# Patient Record
Sex: Female | Born: 1960 | Hispanic: Yes | Marital: Married | Smoking: Never smoker
Health system: Southern US, Community
[De-identification: ages and names within clinical notes are randomized; demographics above are authoritative.]

## PROBLEM LIST (undated history)

## (undated) DIAGNOSIS — M1990 Unspecified osteoarthritis, unspecified site: Secondary | ICD-10-CM

## (undated) DIAGNOSIS — M25579 Pain in unspecified ankle and joints of unspecified foot: Secondary | ICD-10-CM

## (undated) DIAGNOSIS — F411 Generalized anxiety disorder: Secondary | ICD-10-CM

## (undated) DIAGNOSIS — J309 Allergic rhinitis, unspecified: Secondary | ICD-10-CM

## (undated) DIAGNOSIS — I10 Essential (primary) hypertension: Secondary | ICD-10-CM

## (undated) HISTORY — DX: Unspecified osteoarthritis, unspecified site: M19.90

## (undated) HISTORY — PX: ENDOMETRIAL ABLATION: SHX621

## (undated) HISTORY — DX: Pain in unspecified ankle and joints of unspecified foot: M25.579

## (undated) HISTORY — PX: OVARIAN CYST REMOVAL: SHX89

## (undated) HISTORY — DX: Generalized anxiety disorder: F41.1

## (undated) HISTORY — PX: OOPHORECTOMY: SHX86

## (undated) HISTORY — PX: TUBAL LIGATION: SHX77

## (undated) HISTORY — DX: Essential (primary) hypertension: I10

## (undated) HISTORY — DX: Allergic rhinitis, unspecified: J30.9

---

## 1999-05-20 ENCOUNTER — Other Ambulatory Visit: Admission: RE | Admit: 1999-05-20 | Discharge: 1999-05-20 | Payer: Self-pay | Admitting: *Deleted

## 2000-11-10 ENCOUNTER — Other Ambulatory Visit: Admission: RE | Admit: 2000-11-10 | Discharge: 2000-11-10 | Payer: Self-pay | Admitting: Obstetrics and Gynecology

## 2004-08-08 ENCOUNTER — Other Ambulatory Visit: Admission: RE | Admit: 2004-08-08 | Discharge: 2004-08-08 | Payer: Self-pay | Admitting: Obstetrics and Gynecology

## 2004-09-27 ENCOUNTER — Encounter: Admission: RE | Admit: 2004-09-27 | Discharge: 2004-09-27 | Payer: Self-pay | Admitting: Obstetrics and Gynecology

## 2005-11-10 ENCOUNTER — Emergency Department (HOSPITAL_COMMUNITY): Admission: EM | Admit: 2005-11-10 | Discharge: 2005-11-10 | Payer: Self-pay | Admitting: Family Medicine

## 2005-11-21 ENCOUNTER — Encounter: Admission: RE | Admit: 2005-11-21 | Discharge: 2005-11-21 | Payer: Self-pay | Admitting: Family Medicine

## 2006-01-05 ENCOUNTER — Encounter: Admission: RE | Admit: 2006-01-05 | Discharge: 2006-01-29 | Payer: Self-pay | Admitting: Family Medicine

## 2006-07-04 ENCOUNTER — Emergency Department (HOSPITAL_COMMUNITY): Admission: EM | Admit: 2006-07-04 | Discharge: 2006-07-05 | Payer: Self-pay | Admitting: Emergency Medicine

## 2009-08-09 ENCOUNTER — Encounter: Admission: RE | Admit: 2009-08-09 | Discharge: 2009-08-09 | Payer: Self-pay | Admitting: Specialist

## 2009-08-21 ENCOUNTER — Ambulatory Visit (HOSPITAL_COMMUNITY): Admission: RE | Admit: 2009-08-21 | Discharge: 2009-08-21 | Payer: Self-pay | Admitting: Obstetrics and Gynecology

## 2010-08-26 LAB — BASIC METABOLIC PANEL
Calcium: 9.2 mg/dL (ref 8.4–10.5)
Chloride: 101 mEq/L (ref 96–112)
Creatinine, Ser: 0.56 mg/dL (ref 0.4–1.2)
GFR calc Af Amer: >60 mL/min (ref >60)
GFR calc non Af Amer: >60 mL/min (ref >60)
Potassium: 3.9 mEq/L (ref 3.5–5.1)

## 2010-08-26 LAB — CBC
HCT: 39.2 % (ref 36.0–46.0)
Hemoglobin: 13.3 g/dL (ref 12.0–15.0)
RBC: 4.2 MIL/uL (ref 3.87–5.11)
WBC: 6.5 10*3/uL (ref 4.0–10.5)

## 2013-02-26 ENCOUNTER — Encounter: Payer: Self-pay | Admitting: Cardiology

## 2013-02-26 ENCOUNTER — Encounter: Payer: Self-pay | Admitting: *Deleted

## 2013-02-26 DIAGNOSIS — I10 Essential (primary) hypertension: Secondary | ICD-10-CM | POA: Insufficient documentation

## 2013-02-26 DIAGNOSIS — J309 Allergic rhinitis, unspecified: Secondary | ICD-10-CM | POA: Insufficient documentation

## 2013-02-26 DIAGNOSIS — M25579 Pain in unspecified ankle and joints of unspecified foot: Secondary | ICD-10-CM | POA: Insufficient documentation

## 2013-02-26 DIAGNOSIS — F411 Generalized anxiety disorder: Secondary | ICD-10-CM | POA: Insufficient documentation

## 2013-03-04 ENCOUNTER — Encounter: Payer: Self-pay | Admitting: Cardiology

## 2013-03-04 ENCOUNTER — Ambulatory Visit (INDEPENDENT_AMBULATORY_CARE_PROVIDER_SITE_OTHER): Payer: BC Managed Care – PPO | Admitting: Cardiology

## 2013-03-04 VITALS — BP 122/80 | HR 80 | Ht 66.0 in | Wt 198.0 lb

## 2013-03-04 DIAGNOSIS — R0600 Dyspnea, unspecified: Secondary | ICD-10-CM

## 2013-03-04 DIAGNOSIS — R001 Bradycardia, unspecified: Secondary | ICD-10-CM

## 2013-03-04 DIAGNOSIS — F419 Anxiety disorder, unspecified: Secondary | ICD-10-CM

## 2013-03-04 DIAGNOSIS — F411 Generalized anxiety disorder: Secondary | ICD-10-CM

## 2013-03-04 DIAGNOSIS — I498 Other specified cardiac arrhythmias: Secondary | ICD-10-CM

## 2013-03-04 DIAGNOSIS — I10 Essential (primary) hypertension: Secondary | ICD-10-CM

## 2013-03-04 DIAGNOSIS — R0609 Other forms of dyspnea: Secondary | ICD-10-CM

## 2013-03-04 NOTE — Progress Notes (Signed)
.      [address] [place], [place]  [address] Phone: [phone] Fax:  [phone]  Date:  03/04/2013   ID:  Wendy Freeman, DOB 05/15/1961, MRN [id number]  PCP:  No primary provider on file.   History of Present Illness: Wendy Freeman is a 52 y.o. female here for evaluation of dyspnea on exertion. She's been noted to have intermittent shortness of breath sometimes more severe than others associated with a sensation of feeling "tired ". This tends to go on for several minutes and resolves on its own. For instance, she was working on papers at the dinner table and when she stood up, felt tired and heavy breathing. When she took a full inspiration it seemed to resolve. She has been stressed recently with her daughter Tresa Endo who has been in bed rest to headaches. At times has the need to take in increased breaths with episodic tired sensations. Feels like she needs air. She stops and rests but still feels like heart is going faster. Last night she had a little chest pain, pin point above her left breast with same SOB feeling. Can happen with stairs or sitting.  No recent cough, no fevers, no bleeding, no syncope, no chest pain. EKG done at her primary physician's office, Dr. Paulino Rily, showed a heart rate of 46 beats per minute, personally viewed, sinus bradycardia with sinus arrhythmia. QT intervals were normal. No ST segment changes. She is currently not on any beta blockers or calcium channel blockers or other AV nodal blocking agents which would reduce her heart rate.  Brother had MI at 20. He did not take care of himself she says, Tob.   She may have had a stress test many years ago.   Currently takes HTN meds  Today's EKG shows sinus rhythm heart rate of 77 with no ST segment changes. Creatinine is 0.73, potassium 5.4, TSH normal at 1.5, hemoglobin normal at 13.9.   Wt Readings from Last 3 Encounters:  03/04/13 198 lb (89.812 kg)     Past Medical History    Diagnosis Date  . HTN (hypertension)   . Allergic rhinitis   . GAD (generalized anxiety disorder)   . Pain in joint, ankle and foot     Past Surgical History  Procedure Laterality Date  . Oophorectomy      Current Outpatient Prescriptions  Medication Sig Dispense Refill  . albuterol (PROVENTIL HFA;VENTOLIN HFA) 108 (90 BASE) MCG/ACT inhaler Inhale 2 puffs into the lungs every 6 (six) hours as needed for wheezing.      Marland Kitchen aspirin 81 MG tablet Take 81 mg by mouth daily.      . Calcium 600-200 MG-UNIT per tablet Take 1 tablet by mouth daily.      . cholecalciferol (VITAMIN D) 1000 UNITS tablet Take 1,000 Units by mouth daily.      Marland Kitchen FLUoxetine (PROZAC) 10 MG tablet Take 10 mg by mouth daily.      Marland Kitchen losartan-hydrochlorothiazide (HYZAAR) 50-12.5 MG per tablet Take 1 tablet by mouth daily.      . vitamin E 100 UNIT capsule Take 100 Units by mouth daily.       No current facility-administered medications for this visit.    Allergies:   No Known Allergies  Social History:  The patient  reports that she has never smoked. She does not have any smokeless tobacco history on file. She reports that she does not use illicit drugs.   Family  History  Problem Relation Age of Onset  . Heart attack Brother     ROS:  Please see the history of present illness.    No syncope, no orthopnea, no PND, no dysphagia, no strokelike symptoms, no headaches, no vertigo.  All other systems reviewed and negative.   PHYSICAL EXAM: VS:  BP 122/80  Pulse 80  Ht 5\' 6"  (1.676 m)  Wt 198 lb (89.812 kg)  BMI 31.97 kg/m2 Well nourished, well developed, in no acute distress HEENT: normal, [place]/AT, EOMI Neck: no JVD, normal carotid upstroke, no bruit Cardiac:  normal S1, S2; RRR; no murmur Lungs:  clear to auscultation bilaterally, no wheezing, rhonchi or rales Abd: soft, nontender, no hepatomegaly, no bruits Ext: no edema, 2+ distal pulses Skin: warm and dry GU: deferred Neuro: no focal abnormalities noted,  AAO x 3  EKG:  As above. Currently sinus rhythm with no ST segment changes.   ASSESSMENT AND PLAN:  52 year old female with intermittent dyspnea on exertion, transient bradycardia, normal appearing EKG today with strong family history, brother having myocardial infarction at 53 and dying.  1. Dyspnea-I would like to proceed with exercise treadmill test to ensure that she does not have any evidence of ischemia and to check her chronotropic competence given her prior transient bradycardia. Today's EKG is reassuring. I will also check an echocardiogram to encourage proper structure and function of her heart and ensure that she does not have any evidence of cardiomyopathy with her brothers early death. 2. Sinus bradycardia-current EKG reassuring with heart rate of 77. Prior 45 beats per minute. Question vagal. 3. Anxiety-currently under increased stress with her daughter.  Signed, Donato Schultz, MD [facility]  03/04/2013 10:36 AM

## 2013-03-04 NOTE — Patient Instructions (Addendum)
Your physician has requested that you have an exercise tolerance test. For further information please visit [email or web address] Please also follow instruction sheet, as given.  Your physician has requested that you have an echocardiogram. Echocardiography is a painless test that uses sound waves to create images of your heart. It provides your doctor with information about the size and shape of your heart and how well your heart's chambers and valves are working. This procedure takes approximately one hour. There are no restrictions for this procedure.  Your physician recommends that you continue on your current medications as directed. Please refer to the Current Medication list given to you today.  Your physician recommends that you follow-up as needed

## 2013-04-01 ENCOUNTER — Ambulatory Visit (HOSPITAL_COMMUNITY): Payer: BC Managed Care – PPO | Attending: Internal Medicine | Admitting: Radiology

## 2013-04-01 ENCOUNTER — Encounter: Payer: BC Managed Care – PPO | Admitting: Cardiology

## 2013-04-01 ENCOUNTER — Ambulatory Visit (INDEPENDENT_AMBULATORY_CARE_PROVIDER_SITE_OTHER): Payer: BC Managed Care – PPO | Admitting: Cardiology

## 2013-04-01 DIAGNOSIS — I079 Rheumatic tricuspid valve disease, unspecified: Secondary | ICD-10-CM | POA: Insufficient documentation

## 2013-04-01 DIAGNOSIS — R0602 Shortness of breath: Secondary | ICD-10-CM | POA: Insufficient documentation

## 2013-04-01 DIAGNOSIS — R0609 Other forms of dyspnea: Secondary | ICD-10-CM

## 2013-04-01 DIAGNOSIS — R0600 Dyspnea, unspecified: Secondary | ICD-10-CM

## 2013-04-01 DIAGNOSIS — I10 Essential (primary) hypertension: Secondary | ICD-10-CM | POA: Insufficient documentation

## 2013-04-01 DIAGNOSIS — R079 Chest pain, unspecified: Secondary | ICD-10-CM | POA: Insufficient documentation

## 2013-04-01 NOTE — Progress Notes (Signed)
Echocardiogram performed.  

## 2013-04-01 NOTE — Progress Notes (Signed)
Exercise Treadmill Test  Pre-Exercise Testing Evaluation Rhythm: sinus bradycardia  Rate: 54 bpm     Test  Exercise Tolerance Test Ordering MD: Donato Schultz, MD  Interpreting MD: Donato Schultz, MD  Unique Test No: 1  Treadmill:  1  Indication for ETT: exertional dyspnea  Contraindication to ETT: No   Stress Modality: exercise - treadmill  Cardiac Imaging Performed: non   Protocol: standard Bruce - maximal  Max BP:  162/87  Max MPHR (bpm):  **171* 85% MPR (bpm):  145  MPHR obtained (bpm):  157 % MPHR obtained:  91  Reached 85% MPHR (min:sec):  3:35 Total Exercise Time (min-sec):   Workload in METS:  7.0 Borg Scale: 19  Reason ETT Terminated:  dyspnea    ST Segment Analysis At Rest: normal ST segments - no evidence of significant ST depression With Exercise: no evidence of significant ST depression  Other Information Arrhythmia:  No Angina during ETT:  absent (0) Quality of ETT:  diagnostic  ETT Interpretation:  normal - no evidence of ischemia by ST analysis Overall low risk, however moderately decreased exercise tolerance noted.   Comments: Promote exercise. Overall reassuring.   Recommendations: As above.

## 2014-01-12 ENCOUNTER — Encounter: Payer: Self-pay | Admitting: Internal Medicine

## 2014-02-24 ENCOUNTER — Encounter: Payer: Self-pay | Admitting: Internal Medicine

## 2014-03-13 ENCOUNTER — Encounter: Payer: BC Managed Care – PPO | Admitting: Internal Medicine

## 2014-05-05 ENCOUNTER — Encounter: Payer: BC Managed Care – PPO | Admitting: Internal Medicine

## 2014-12-12 ENCOUNTER — Other Ambulatory Visit: Payer: Self-pay | Admitting: Family Medicine

## 2014-12-12 DIAGNOSIS — R1084 Generalized abdominal pain: Secondary | ICD-10-CM

## 2014-12-15 ENCOUNTER — Ambulatory Visit
Admission: RE | Admit: 2014-12-15 | Discharge: 2014-12-15 | Disposition: A | Discharge status: Home / Self Care | Payer: Self-pay | Source: Ambulatory Visit | Attending: Family Medicine | Admitting: Family Medicine

## 2014-12-15 DIAGNOSIS — R1084 Generalized abdominal pain: Secondary | ICD-10-CM

## 2016-12-16 IMAGING — CT CT ABD-PELV W/O CM
1 of 2 series · 15 of 32 positions shown, 19 images · non-contrast
Comparison: None.

CLINICAL DATA: Generalized abdominal pain for 3 weeks. Right groin
soft tissue prominence. Possible inguinal hernia.

EXAM:
CT ABDOMEN AND PELVIS WITHOUT CONTRAST
TECHNIQUE: Multidetector CT imaging of the abdomen and pelvis was performed
following the standard protocol without IV contrast.

[Series 3: abd pelvis wo 5.0 i41s 1 · axial · 0.83mm/px · z∈[-528,-88]mm · 15 of 96 slices shown, 19 images]
[im 4/96  soft-tissue]
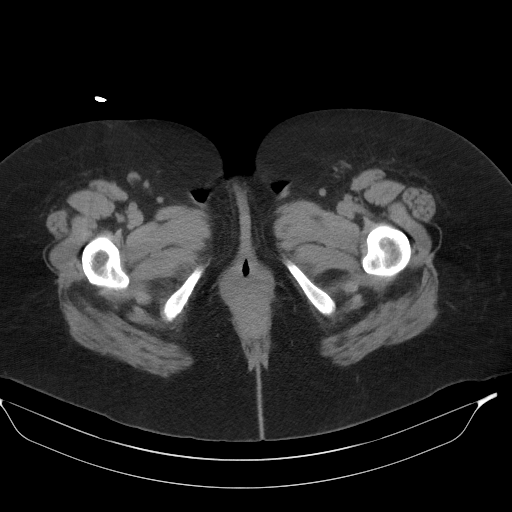
[im 4/96  bone]
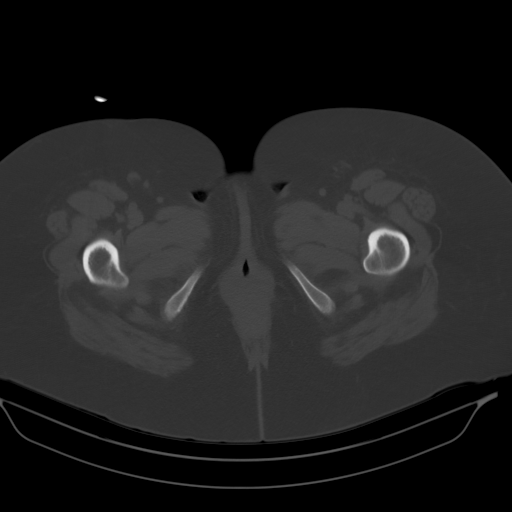
[im 12/96  soft-tissue]
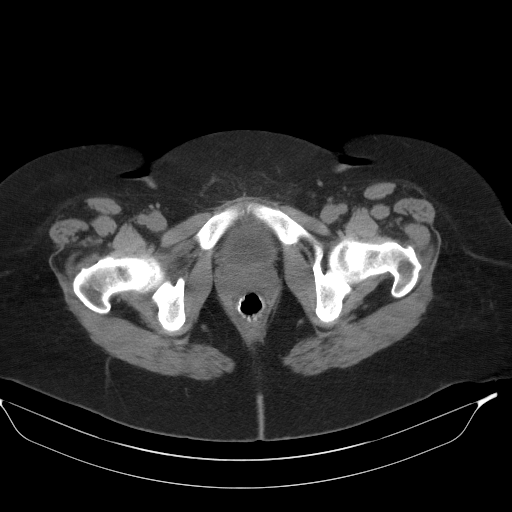
[im 20/96  soft-tissue]
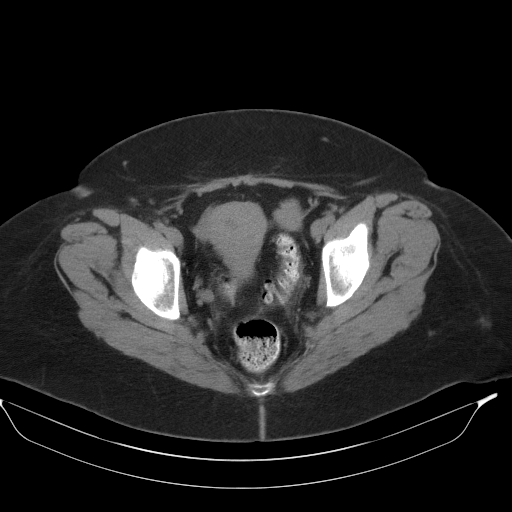
[im 27/96  soft-tissue]
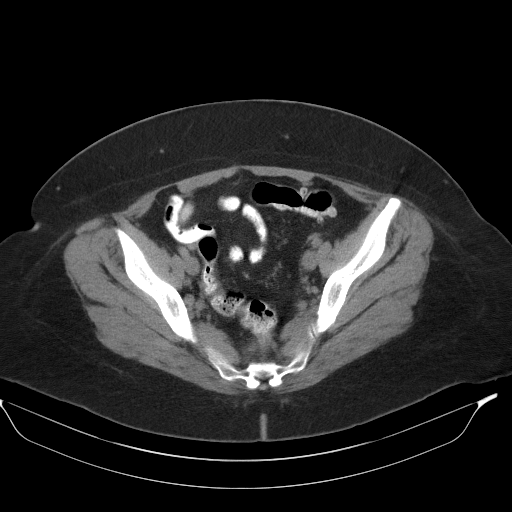
[im 35/96  soft-tissue]
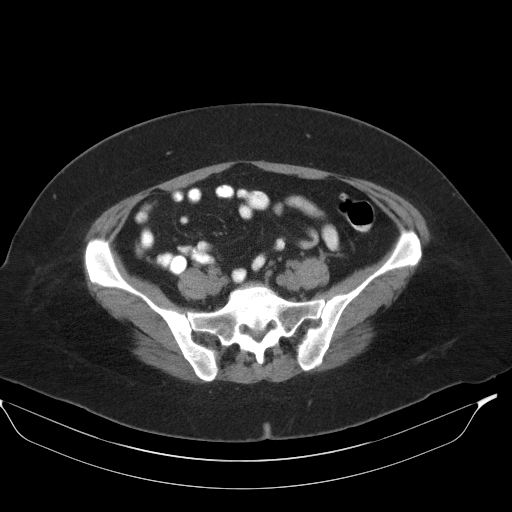
[im 42/96  soft-tissue]
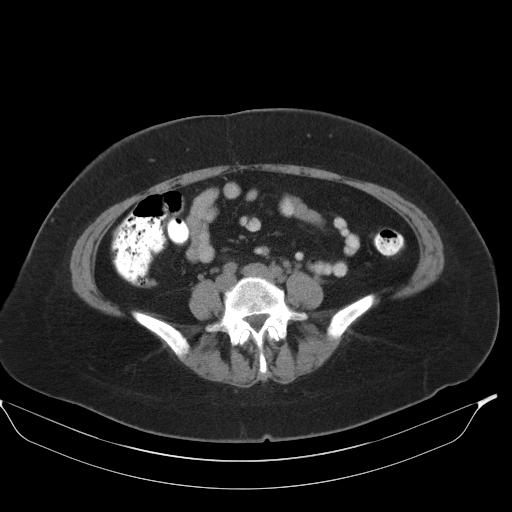
[im 50/96  soft-tissue]
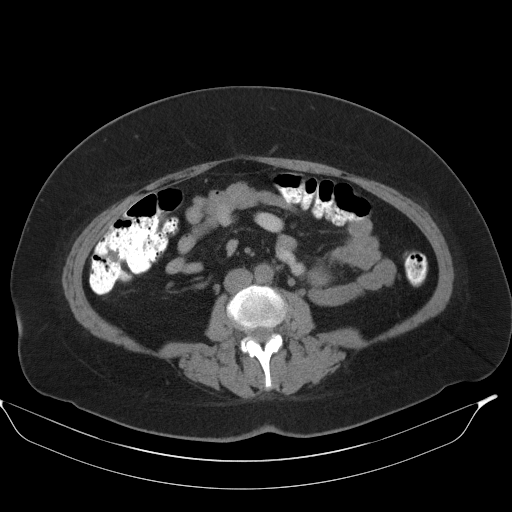
[im 54/96  soft-tissue]
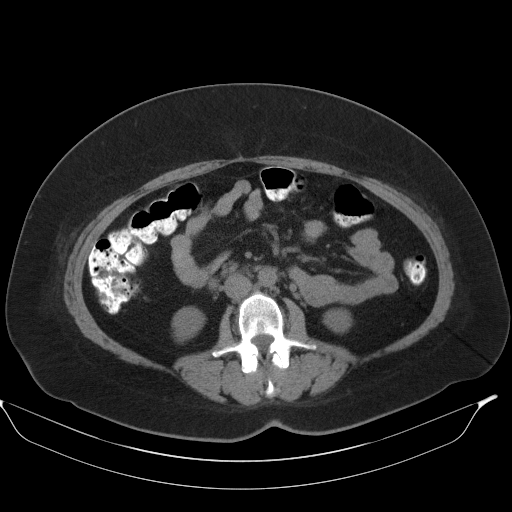
[im 61/96  soft-tissue]
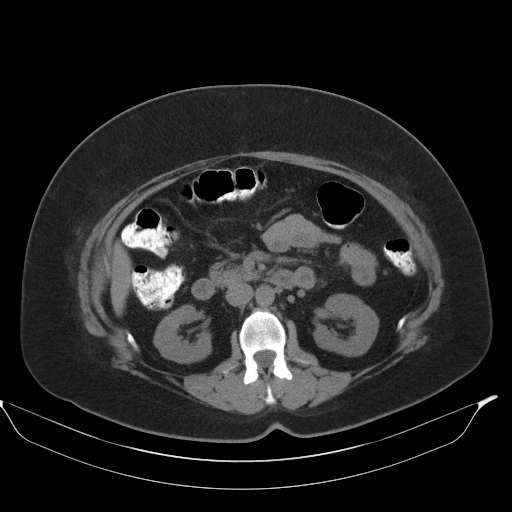
[im 61/96  bone]
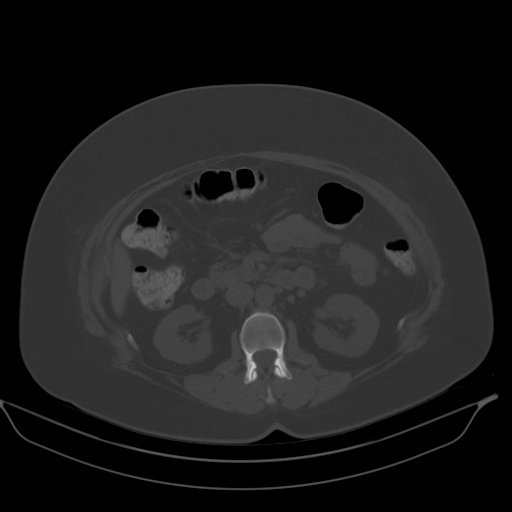
[im 69/96  soft-tissue]
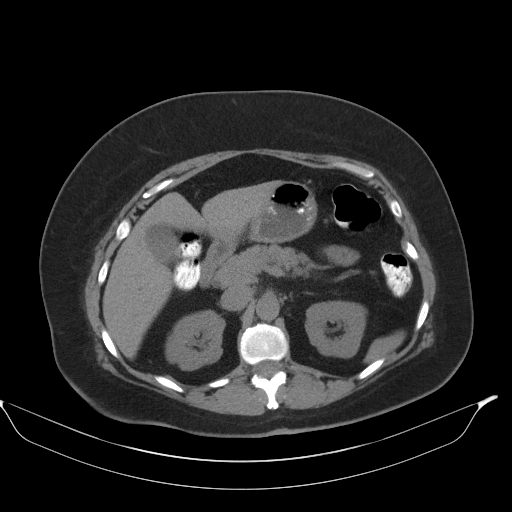
[im 77/96  soft-tissue]
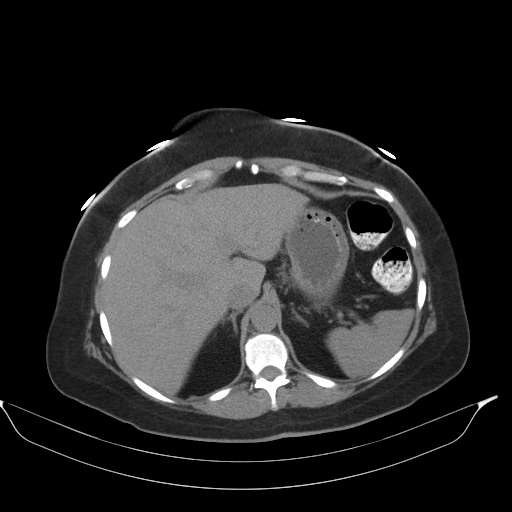
[im 80/96  lung]
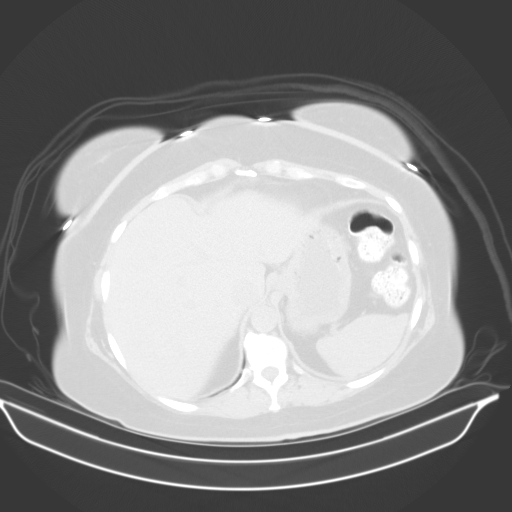
[im 84/96  soft-tissue]
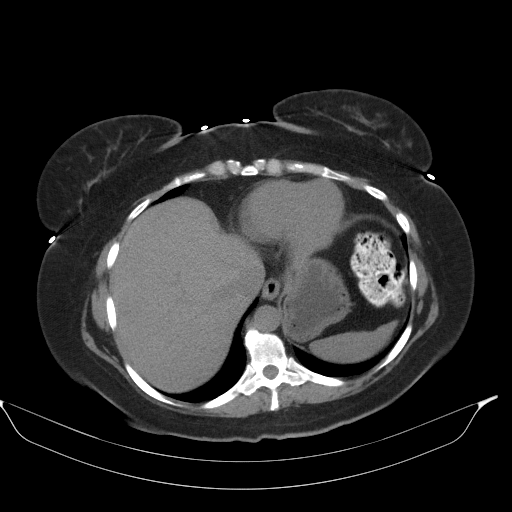
[im 84/96  lung]
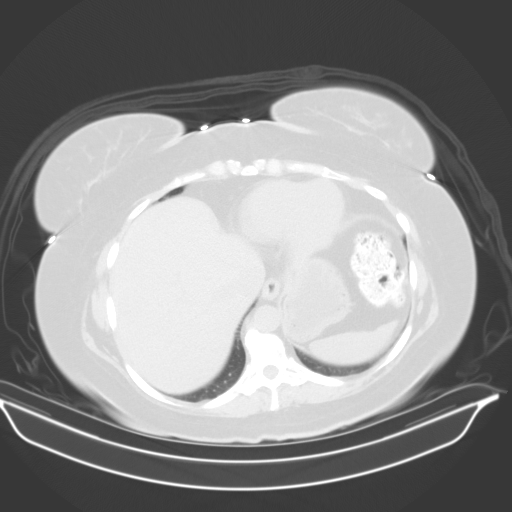
[im 88/96  lung]
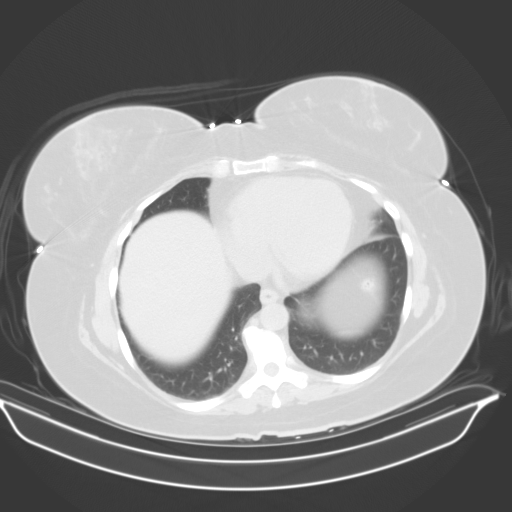
[im 92/96  soft-tissue]
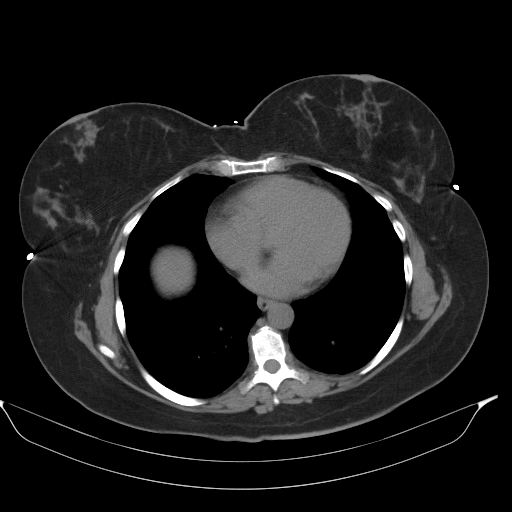
[im 92/96  lung]
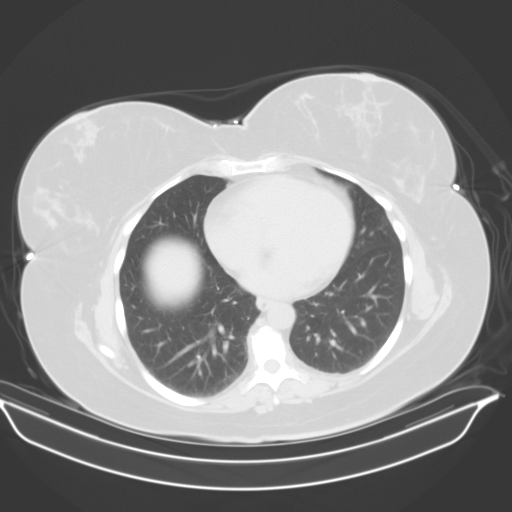

[15 of 32 positions shown; findings below may reference images not displayed]

FINDINGS: Lower chest: No acute findings.

Hepatobiliary: No mass visualized on this unenhanced exam.
Gallbladder is unremarkable.

Pancreas: No mass or inflammatory process visualized on this
unenhanced exam.

Spleen:  Within normal limits in size.

Adrenal Glands:  No masses identified.

Kidneys/Urinary tract: No evidence of urolithiasis or
hydronephrosis.

Stomach/Bowel/Peritoneum: Diverticulosis is seen involving the
descending and sigmoid colon. No evidence of diverticulitis or other
inflammatory process. No abnormal fluid collections. Normal appendix
visualized.

Vascular/Lymphatic: No pathologically enlarged lymph nodes
identified. No abdominal aortic aneurysm or other significant
retroperitoneal abnormality demonstrated.

Reproductive:  No mass or other significant abnormality noted.

Other:  No evidence of inguinal hernia or mass.

Musculoskeletal:  No suspicious bone lesions identified.
IMPRESSION: Colonic diverticulosis. No radiographic evidence of diverticulitis
or other acute findings.

No evidence of inguinal hernia or mass.

## 2018-02-24 ENCOUNTER — Ambulatory Visit: Payer: BC Managed Care – PPO | Admitting: Obstetrics and Gynecology

## 2018-02-24 ENCOUNTER — Encounter: Payer: Self-pay | Admitting: Obstetrics and Gynecology

## 2018-02-24 VITALS — BP 110/64 | HR 66 | Resp 14 | Ht 66.0 in | Wt 210.8 lb

## 2018-02-24 DIAGNOSIS — N3946 Mixed incontinence: Secondary | ICD-10-CM

## 2018-02-24 DIAGNOSIS — N8110 Cystocele, unspecified: Secondary | ICD-10-CM | POA: Diagnosis not present

## 2018-02-24 LAB — POCT URINALYSIS DIPSTICK
Bilirubin, UA: NEGATIVE
GLUCOSE UA: NEGATIVE
KETONES UA: NEGATIVE
Nitrite, UA: NEGATIVE
Protein, UA: NEGATIVE
Urobilinogen, UA: 0.2 E.U./dL
pH, UA: 5 (ref 5.0–8.0)

## 2018-02-24 NOTE — Progress Notes (Signed)
GYNECOLOGY  VISIT   HPI: 57 y.o.   Married  Hispanic  female   No obstetric history on file. with Patient's last menstrual period was 07/03/2009 (approximate).   here for   Urinary incontinence for a few years.  Referred by a friend.   Leaks with a cough, laugh, sneeze or exercise.  Voiding often to avoid leaking large amounts.  Sometimes small amounts.  Stream is not straight. DF - every 30 min to 3 hours. NF - 2 to 4 times.  4 times if she drinks water before going to bed. No enuresis.  Feels she is voiding well.  Denies dysuria, hematuria.  UTIs in the past.  No pyelo or renal stones.   No prior bladder surgery.   No fecal incontinence.  No real constipation unless she changes her diet.   No prolapse she is aware.   Sexually active and no pain of problems with intercourse.   Told her uterus had a different position.   Drinks Public relations account executive.   Urine dip - trace leukocytes.  PCP - Mila Palmer, MD GYN [facility] Coho, MD  GYNECOLOGIC HISTORY: Patient's last menstrual period was 07/03/2009 (approximate). Contraception: None  Menopausal hormone therapy:  None  Last mammogram:  3/19 at [facility] Normal per patient  Last pap smear:   07/2017 [facility]  Normal per patient         OB History   None      Gravida 2, Para 2.  First delivery 6 pounds, 13 ounces, forceps.  Second delivery 6 pounds, 5 ounces.   Patient Active Problem List   Diagnosis Date Noted  . HTN (hypertension)   . Allergic rhinitis   . GAD (generalized anxiety disorder)   . Pain in joint, ankle and foot     Past Medical History:  Diagnosis Date  . Allergic rhinitis   . GAD (generalized anxiety disorder)   . HTN (hypertension)   . Pain in joint, ankle and foot     Past Surgical History:  Procedure Laterality Date  . ENDOMETRIAL ABLATION    . OOPHORECTOMY     Right oophorectomy  . OVARIAN CYST REMOVAL Left   . TUBAL LIGATION      Current  Outpatient Medications  Medication Sig Dispense Refill  . albuterol (PROVENTIL HFA;VENTOLIN HFA) 108 (90 BASE) MCG/ACT inhaler Inhale 2 puffs into the lungs every 6 (six) hours as needed for wheezing.    Marland Kitchen aspirin 81 MG tablet Take 81 mg by mouth daily.    . Calcium 600-200 MG-UNIT per tablet Take 1 tablet by mouth daily.    . cholecalciferol (VITAMIN D) 1000 UNITS tablet Take 1,000 Units by mouth daily.    Marland Kitchen FLUoxetine (PROZAC) 10 MG tablet Take 10 mg by mouth daily.    Marland Kitchen losartan-hydrochlorothiazide (HYZAAR) 50-12.5 MG per tablet Take 1 tablet by mouth daily.    . vitamin E 100 UNIT capsule Take 100 Units by mouth daily.     No current facility-administered medications for this visit.      ALLERGIES: Patient has no known allergies.  Family History  Problem Relation Age of Onset  . Heart attack Brother     Social History   Socioeconomic History  . Marital status: Married    Spouse name: Not on file  . Number of children: Not on file  . Years of education: Not on file  . Highest education level: Not on file  Occupational History  . Not  on file  Social Needs  . Financial resource strain: Not on file  . Food insecurity:    Worry: Not on file    Inability: Not on file  . Transportation needs:    Medical: Not on file    Non-medical: Not on file  Tobacco Use  . Smoking status: Never Smoker  Substance and Sexual Activity  . Alcohol use: Not on file  . Drug use: No  . Sexual activity: Not on file  Lifestyle  . Physical activity:    Days per week: Not on file    Minutes per session: Not on file  . Stress: Not on file  Relationships  . Social connections:    Talks on phone: Not on file    Gets together: Not on file    Attends religious service: Not on file    Active member of club or organization: Not on file    Attends meetings of clubs or organizations: Not on file    Relationship status: Not on file  . Intimate partner violence:    Fear of current or ex partner:  Not on file    Emotionally abused: Not on file    Physically abused: Not on file    Forced sexual activity: Not on file  Other Topics Concern  . Not on file  Social History Narrative   Daughter, Tresa Endo, has pseudotumor cerebri.    Review of Systems  Genitourinary:       Urinary incontinence    PHYSICAL EXAMINATION:    BP 110/64   Pulse 66   Resp 14   Ht 5\' 6"  (1.676 m)   Wt 210 lb 12.8 oz (95.6 kg)   LMP 07/03/2009 (Approximate)   BMI 34.02 kg/m     General appearance: alert, cooperative and appears stated age Head: Normocephalic, without obvious abnormality, atraumatic Neck: no adenopathy, supple, symmetrical, trachea midline and thyroid normal to inspection and palpation Lungs: clear to auscultation bilaterally Heart: regular rate and rhythm Abdomen: soft, non-tender, no masses,  no organomegaly Extremities: extremities normal, atraumatic, no cyanosis or edema Skin: Skin color, texture, turgor normal. No rashes or lesions No abnormal inguinal nodes palpated Neurologic: Grossly normal  Pelvic: External genitalia:  no lesions              Urethra:  normal appearing urethra with no masses, tenderness or lesions              Bartholins and Skenes: normal                 Vagina: normal appearing vagina with normal color and discharge, no lesions.  First degree cystocele.              Cervix: no lesions                Bimanual Exam:  Uterus:  normal size, contour, position, consistency, mobility, non-tender              Adnexa: no mass, fullness, tenderness              Rectal exam: Yes.  .  Confirms.              Anus:  normal sphincter tone, no lesions  Chaperone was present for exam.  ASSESSMENT  Mixed incontinence.  First degree bladder prolapse.   PLAN  I have had a comprehensive discussion with the patient regarding prolapse and urinary incontinence and a discussion of pelvic anatomy.  I have provided reading  materials from [employer] regarding prolapse and  incontinence in general as well as medical and surgical treatment for these conditions.   We talked about observational management also.  Medical treatments may include physical therapy, pessary use, Impressa, medication, weight loss, and avoidance of bladder irritants.  We discussed surgical care options including an anterior and TVT Exact midurethral sling and cystoscopy. We discussed benefits and risks of surgery which include but are not limited to infection, cystotomy, erosion and exposure of permanent mesh in the vagina, urethra, bladder or ureters, slower voiding and urinary retention, overactive bladder symptoms, reoperation, and recurrence of prolapse and incontinence.  I have discussed surgical expectations regarding the procedures and success rates, outcomes, and recovery.     I would proceed with urodynamic testing prior to doing surgery if she chooses this option.  Urine micro and culture.   Follow up prn.   An After Visit Summary was printed and given to the patient.  __45____ minutes face to face time of which over 50% was spent in counseling.

## 2018-02-25 LAB — URINE CULTURE

## 2018-02-25 LAB — URINALYSIS, MICROSCOPIC ONLY: Casts: NONE SEEN /lpf

## 2018-02-27 DIAGNOSIS — N8110 Cystocele, unspecified: Secondary | ICD-10-CM | POA: Insufficient documentation

## 2018-02-27 DIAGNOSIS — N3946 Mixed incontinence: Secondary | ICD-10-CM | POA: Insufficient documentation

## 2019-02-28 ENCOUNTER — Ambulatory Visit: Payer: BC Managed Care – PPO | Admitting: Obstetrics and Gynecology

## 2019-08-13 ENCOUNTER — Ambulatory Visit: Payer: BC Managed Care – PPO | Attending: Internal Medicine

## 2019-08-13 DIAGNOSIS — Z23 Encounter for immunization: Secondary | ICD-10-CM

## 2019-08-13 NOTE — Progress Notes (Signed)
   Covid-19 Vaccination Clinic  Name:  LATRICA CLOWERS    MRN: [id number] DOB: [birth year]  08/13/2019  Ms. Lauro was observed post Covid-19 immunization for 15 minutes without incident. She was provided with Vaccine Information Sheet and instruction to access the V-Safe system.   Ms. Verdejo was instructed to call 911 with any severe reactions post vaccine: Marland Kitchen Difficulty breathing  . Swelling of face and throat  . A fast heartbeat  . A bad rash all over body  . Dizziness and weakness   Immunizations Administered    Name Date Dose VIS Date Route   Pfizer COVID-19 Vaccine 08/13/2019  9:31 AM 0.3 mL 05/13/2019 Intramuscular   Manufacturer: [employer], [employer]   Lot: [id number]   NDC: [address]-5

## 2019-09-05 ENCOUNTER — Ambulatory Visit: Payer: BC Managed Care – PPO | Attending: Internal Medicine

## 2019-09-05 ENCOUNTER — Ambulatory Visit: Payer: BC Managed Care – PPO

## 2019-09-05 DIAGNOSIS — Z23 Encounter for immunization: Secondary | ICD-10-CM

## 2019-09-05 NOTE — Progress Notes (Signed)
   Covid-19 Vaccination Clinic  Name:  Wendy Freeman    MRN: [id number] DOB: 1960/06/16  09/05/2019  [name] was observed post Covid-19 immunization for 15 minutes without incident. She was provided with Vaccine Information Sheet and instruction to access the V-Safe system.   Ms. Six was instructed to call 911 with any severe reactions post vaccine: Marland Kitchen Difficulty breathing  . Swelling of face and throat  . A fast heartbeat  . A bad rash all over body  . Dizziness and weakness   Immunizations Administered    Name Date Dose VIS Date Route   Pfizer COVID-19 Vaccine 09/05/2019 11:27 AM 0.3 mL 05/13/2019 Intramuscular   Manufacturer: [employer], [employer]   Lot: [id number]   NDC: [address]-2

## 2019-10-18 ENCOUNTER — Other Ambulatory Visit (HOSPITAL_BASED_OUTPATIENT_CLINIC_OR_DEPARTMENT_OTHER): Payer: Self-pay | Admitting: Family Medicine

## 2019-10-18 DIAGNOSIS — R102 Pelvic and perineal pain: Secondary | ICD-10-CM

## 2019-10-20 ENCOUNTER — Ambulatory Visit (HOSPITAL_BASED_OUTPATIENT_CLINIC_OR_DEPARTMENT_OTHER): Admission: RE | Admit: 2019-10-20 | Payer: BC Managed Care – PPO | Source: Ambulatory Visit

## 2022-03-18 ENCOUNTER — Other Ambulatory Visit (HOSPITAL_BASED_OUTPATIENT_CLINIC_OR_DEPARTMENT_OTHER): Payer: Self-pay | Admitting: Family Medicine

## 2022-03-18 ENCOUNTER — Ambulatory Visit (HOSPITAL_BASED_OUTPATIENT_CLINIC_OR_DEPARTMENT_OTHER)
Admission: RE | Admit: 2022-03-18 | Discharge: 2022-03-18 | Disposition: A | Discharge status: Home / Self Care | Payer: Commercial Managed Care - HMO | Source: Ambulatory Visit | Attending: Family Medicine | Admitting: Family Medicine

## 2022-03-18 DIAGNOSIS — R1031 Right lower quadrant pain: Secondary | ICD-10-CM

## 2022-03-18 LAB — POCT I-STAT CREATININE: Creatinine, Ser: 0.8 mg/dL (ref 0.44–1.00)

## 2022-03-18 MED ORDER — IOHEXOL 300 MG/ML  SOLN
100.0000 mL | Freq: Once | INTRAMUSCULAR | Status: AC | PRN
  Administered 2022-03-18: 80 mL via INTRAVENOUS

## 2022-03-20 ENCOUNTER — Other Ambulatory Visit: Payer: Self-pay | Admitting: Family Medicine

## 2022-03-20 DIAGNOSIS — N858 Other specified noninflammatory disorders of uterus: Secondary | ICD-10-CM

## 2022-03-21 ENCOUNTER — Ambulatory Visit (HOSPITAL_COMMUNITY)
Admission: RE | Admit: 2022-03-21 | Discharge: 2022-03-21 | Disposition: A | Discharge status: Home / Self Care | Payer: Commercial Managed Care - HMO | Source: Ambulatory Visit | Attending: Family Medicine | Admitting: Family Medicine

## 2022-03-21 DIAGNOSIS — N858 Other specified noninflammatory disorders of uterus: Secondary | ICD-10-CM | POA: Insufficient documentation

## 2022-03-25 ENCOUNTER — Other Ambulatory Visit: Payer: Commercial Managed Care - HMO

## 2022-05-15 ENCOUNTER — Encounter (INDEPENDENT_AMBULATORY_CARE_PROVIDER_SITE_OTHER): Payer: Self-pay | Admitting: Internal Medicine

## 2022-05-15 ENCOUNTER — Ambulatory Visit (INDEPENDENT_AMBULATORY_CARE_PROVIDER_SITE_OTHER): Payer: Commercial Managed Care - HMO | Admitting: Internal Medicine

## 2022-05-15 VITALS — BP 137/86 | HR 75 | Temp 98.3°F | Ht 65.0 in | Wt 209.0 lb

## 2022-05-15 DIAGNOSIS — Z6834 Body mass index (BMI) 34.0-34.9, adult: Secondary | ICD-10-CM | POA: Diagnosis not present

## 2022-05-15 DIAGNOSIS — E669 Obesity, unspecified: Secondary | ICD-10-CM

## 2022-05-15 DIAGNOSIS — M170 Bilateral primary osteoarthritis of knee: Secondary | ICD-10-CM

## 2022-05-15 DIAGNOSIS — I10 Essential (primary) hypertension: Secondary | ICD-10-CM | POA: Diagnosis not present

## 2022-05-15 DIAGNOSIS — Z0289 Encounter for other administrative examinations: Secondary | ICD-10-CM

## 2022-05-15 NOTE — Progress Notes (Signed)
Office: [phone]  /  Fax: [phone]   Initial Visit  Wendy Freeman was seen in clinic today to evaluate for obesity. She is interested in losing weight to improve overall health and reduce the risk of weight related complications. She presents today to review program treatment options, initial physical assessment, and evaluation.     She was referred by: Specialist gynecologist  When asked what else they would like to accomplish? She states: Adopt healthier eating patterns, Improve quality of life, and Improve appearance  When asked how has your weight affected you? She states: Contributed to medical problems and Contributed to orthopedic problems or mobility issues  Some associated conditions: Hypertension and Arthritis  Contributing factors: Family history, Disruption of circadian rhythm, Reduced physical activity, Pregnancy, and Menopause  Weight promoting medications identified: None  Current nutrition plan: Portion control / smart choices  Current level of physical activity: None  Current or previous pharmacotherapy: None  Response to medication: Never tried medications   Past medical history includes:   Past Medical History:  Diagnosis Date   Allergic rhinitis    GAD (generalized anxiety disorder)    HTN (hypertension)    Pain in joint, ankle and foot      Objective:   BP 137/86   Pulse 75   Temp 98.3 F (36.8 C)   Ht 5\' 5"  (1.651 m)   Wt 209 lb (94.8 kg)   SpO2 97%   BMI 34.78 kg/m  She was weighed on the bioimpedance scale: Body mass index is 34.78 kg/m.  Peak Weight: 217,Visceral Fat Rating: 13, Body Fat%: 47.2, Weight trend over the last 12 months: Decreasing  General:  Alert, oriented and cooperative. Patient is in no acute distress.  Respiratory: Normal respiratory effort, no problems with respiration noted  Extremities: Normal range of motion.    Mental Status: Normal mood and affect. Normal behavior. Normal judgment and thought  content.   Assessment and Plan:  1. Class 1 obesity with serious comorbidity and body mass index (BMI) of 34.0 to 34.9 in adult, unspecified obesity type We reviewed weight, biometrics, associated medical conditions and contributing factors with patient. She would benefit from weight loss therapy via a modified calorie, low-carb, high-protein nutritional plan tailored to their REE (resting energy expenditure) which will be determined by indirect calorimetry.  We will also assess for cardiometabolic risk and nutritional derangements via fasting serologies at her next appointment.   2. Hypertension, unspecified type Blood pressure today slightly above target of less than 130/80.  She is currently on losartan hydrochlorothiazide without any adverse effects.  I recommend she monitor her blood pressure in the morning before medications and before bedtime.  We will check renal parameters with intake labs.  I tried to review labs in Care Everywhere but there is nothing available her most recent BMP is from 2011.  3. Osteoarthritis of both knees, unspecified osteoarthritis type Patient reports having bilateral knee pain particularly when walking.  This may improve with 10 to 15% of body weight loss.        Obesity Treatment / Action Plan:  Patient will work on garnering support from family and friends to begin weight loss journey. Will work on eliminating or reducing the presence of highly palatable, calorie dense foods in the home. Will complete provided nutritional and psychosocial assessment questionnaire before the next appointment. Will be scheduled for indirect calorimetry to determine resting energy expenditure in a fasting state.  This will allow 2012 to create a reduced calorie,  high-protein meal plan to promote loss of fat mass while preserving muscle mass. Will think about ideas on how to incorporate physical activity into their daily routine. Counseled on the health benefits of losing 5%-15%  of total body weight. Was counseled on nutritional approaches to weight loss and benefits of complex carbs and high quality protein as part of nutritional weight management.  Obesity Education Performed Today:  She was weighed on the bioimpedance scale and results were discussed and documented in the synopsis.  We discussed obesity as a disease and the importance of a more detailed evaluation of all the factors contributing to the disease.  We discussed the importance of long term lifestyle changes which include nutrition, exercise and behavioral modifications as well as the importance of customizing this to her specific health and social needs.  We discussed the benefits of reaching a healthier weight to alleviate the symptoms of existing conditions and reduce the risks of the biomechanical, metabolic and psychological effects of obesity.  KENZA MUNAR appears to be in the action stage of change and states they are ready to start intensive lifestyle modifications and behavioral modifications.  30 minutes was spent today on this visit including the above counseling, pre-visit chart review, and post-visit documentation.  Reviewed by clinician on day of visit: allergies, medications, problem list, medical history, surgical history, family history, social history, and previous encounter notes.    I have reviewed the above documentation for accuracy and completeness, and I agree with the above.  Worthy Rancher, MD

## 2022-06-18 ENCOUNTER — Encounter (INDEPENDENT_AMBULATORY_CARE_PROVIDER_SITE_OTHER): Payer: Self-pay | Admitting: Internal Medicine

## 2022-06-18 ENCOUNTER — Ambulatory Visit (INDEPENDENT_AMBULATORY_CARE_PROVIDER_SITE_OTHER): Payer: Commercial Managed Care - HMO | Admitting: Internal Medicine

## 2022-06-18 VITALS — BP 126/84 | HR 72 | Temp 98.3°F | Ht 65.0 in | Wt 214.0 lb

## 2022-06-18 DIAGNOSIS — E669 Obesity, unspecified: Secondary | ICD-10-CM | POA: Diagnosis not present

## 2022-06-18 DIAGNOSIS — R5383 Other fatigue: Secondary | ICD-10-CM | POA: Diagnosis not present

## 2022-06-18 DIAGNOSIS — M170 Bilateral primary osteoarthritis of knee: Secondary | ICD-10-CM | POA: Diagnosis not present

## 2022-06-18 DIAGNOSIS — Z1331 Encounter for screening for depression: Secondary | ICD-10-CM

## 2022-06-18 DIAGNOSIS — Z6835 Body mass index (BMI) 35.0-35.9, adult: Secondary | ICD-10-CM

## 2022-06-18 DIAGNOSIS — R0602 Shortness of breath: Secondary | ICD-10-CM

## 2022-06-18 DIAGNOSIS — I10 Essential (primary) hypertension: Secondary | ICD-10-CM | POA: Diagnosis not present

## 2022-06-18 DIAGNOSIS — E559 Vitamin D deficiency, unspecified: Secondary | ICD-10-CM | POA: Diagnosis not present

## 2022-06-18 DIAGNOSIS — E66812 Obesity, class 2: Secondary | ICD-10-CM

## 2022-06-19 LAB — HEMOGLOBIN A1C
Est. average glucose Bld gHb Est-mCnc: 103 mg/dL
Hgb A1c MFr Bld: 5.2 % (ref 4.8–5.6)

## 2022-06-19 LAB — INSULIN, RANDOM: INSULIN: 5.7 u[IU]/mL (ref 2.6–24.9)

## 2022-06-24 NOTE — Progress Notes (Signed)
Chief Complaint:   Wendy Freeman (MR# [id number]) is a 62 y.o. female who presents for evaluation and treatment of obesity and related comorbidities. Current BMI is Body mass index is 35.61 kg/m. Wendy Freeman has been struggling with her weight for many years and has been unsuccessful in either losing weight, maintaining weight loss, or reaching her healthy weight goal.  Wendy Freeman is currently in the action stage of change and ready to dedicate time achieving and maintaining a healthier weight. Wendy Freeman is interested in becoming our patient and working on intensive lifestyle modifications including (but not limited to) diet and exercise for weight loss.  Wendy Freeman's habits were reviewed today and are as follows: Her family eats meals together, she thinks her family will eat healthier with her, her desired weight loss is 54 lbs, she has been heavy most of her life, she started gaining weight after childbirth, her heaviest weight ever was 217 pounds, she has significant food cravings issues, she snacks frequently in the evenings, she skips meals frequently, she is frequently drinking liquids with calories, and she frequently eats larger portions than normal.  Depression Screen [name]'s Food and Mood (modified PHQ-9) score was 7.  Subjective:   1. Other fatigue Wendy Freeman denies daytime somnolence and admits to waking up still tired. Patient has a history of symptoms of daytime fatigue and morning fatigue. Wendy Freeman generally gets 7 or 9 hours of sleep per night, and states that she has nightime awakenings and generally restful sleep. Snoring is present. Apneic episodes are not present. Epworth Sleepiness Score is 0. Reviewed labs from [facility]. Normal limits CBC, CMP, and TSH.   2. SOB (shortness of breath) on exertion Wendy Freeman notes increasing shortness of breath with exercising and seems to be worsening over time with weight gain. She notes getting out of breath sooner with activity than she  used to. This has not gotten worse recently. Wendy Freeman denies shortness of breath at rest or orthopnea.  3. Hypertension, unspecified type Blood pressure close to goal. On losartan HCTZ. Reviewed labs from [facility] on the patient's phone. Renal parameters within normal limits. I discussed labs with the patient today.   4. Osteoarthritis of both knees, unspecified osteoarthritis type Due to biomechanical complications from weight.   5. Vitamin D deficiency Vitamin D level 68 at [facility]. I discussed labs with the patient today.   Assessment/Plan:   1. Other fatigue Wendy Freeman does feel that her weight is causing her energy to be lower than it should be. Fatigue may be related to obesity, depression or many other causes. Labs will be ordered, and in the meanwhile, Wendy Freeman will focus on self care including making healthy food choices, increasing physical activity and focusing on stress reduction.  - EKG 12-Lead  2. SOB (shortness of breath) on exertion [name] does feel that she gets out of breath more easily that she used to when she exercises. [name]'s shortness of breath appears to be obesity related and exercise induced. She has agreed to work on weight loss and gradually increase exercise to treat her exercise induced shortness of breath. Will continue to monitor closely.  3. Hypertension, unspecified type Work on weight loss therapy. Continue current regimen.   4. Osteoarthritis of both knees, unspecified osteoarthritis type Work on weight loss therapy at 10%. May improve pain.   5. Vitamin D deficiency Decrease Vitamin D supplementation to every other day to avoid over-supplementation.   6. Depression screen Wendy Freeman had a positive depression screening. Depression is commonly associated  with obesity and often results in emotional eating behaviors. We will monitor this closely and work on CBT to help improve the non-hunger eating patterns. Referral to Psychology may be required if no improvement is  seen as she continues in our clinic.  7. Obesity with current BMI of 35.7 We will check labs today.   - Hemoglobin A1c - Insulin, random  Wendy Freeman is currently in the action stage of change and her goal is to continue with weight loss efforts. I recommend Wendy Freeman begin the structured treatment plan as follows:  She has agreed to the Category 2 Plan.  Exercise goals: All adults should avoid inactivity. Some physical activity is better than none, and adults who participate in any amount of physical activity gain some health benefits.   Behavioral modification strategies: increasing lean protein intake, decreasing simple carbohydrates, increasing vegetables, increasing water intake, decreasing liquid calories, decreasing eating out, no skipping meals, meal planning and cooking strategies, better snacking choices, avoiding temptations, and planning for success.  She was informed of the importance of frequent follow-up visits to maximize her success with intensive lifestyle modifications for her multiple health conditions. She was informed we would discuss her lab results at her next visit unless there is a critical issue that needs to be addressed sooner. Wendy Freeman agreed to keep her next visit at the agreed upon time to discuss these results.  Objective:   Blood pressure 126/84, pulse 72, temperature 98.3 F (36.8 C), height 5\' 5"  (1.651 m), weight 214 lb (97.1 kg), last menstrual period 07/03/2009, SpO2 98 %. Body mass index is 35.61 kg/m.  EKG: Normal sinus rhythm, rate 67 BPM.  Indirect Calorimeter completed today shows a VO2 of 196 and a REE of 1354.  Her calculated basal metabolic rate is 1696 thus her basal metabolic rate is worse than expected.  General: Cooperative, alert, well developed, in no acute distress. HEENT: Conjunctivae and lids unremarkable. Cardiovascular: Regular rhythm.  Lungs: Normal work of breathing. Neurologic: No focal deficits.   Lab Results  Component Value Date    CREATININE 0.80 03/18/2022   BUN 8 08/20/2009   NA 134 (L) 08/20/2009   K 3.9 08/20/2009   CL 101 08/20/2009   CO2 27 08/20/2009   No results found for: "ALT", "AST", "GGT", "ALKPHOS", "BILITOT" Lab Results  Component Value Date   HGBA1C 5.2 06/18/2022   Lab Results  Component Value Date   INSULIN 5.7 06/18/2022   No results found for: "TSH" No results found for: "CHOL", "HDL", "LDLCALC", "LDLDIRECT", "TRIG", "CHOLHDL" Lab Results  Component Value Date   WBC 6.5 08/20/2009   HGB 13.3 08/20/2009   HCT 39.2 08/20/2009   MCV 93.4 08/20/2009   PLT 236 08/20/2009   No results found for: "IRON", "TIBC", "FERRITIN"  Attestation Statements:   Reviewed by clinician on day of visit: allergies, medications, problem list, medical history, surgical history, family history, social history, and previous encounter notes.   Wilhemena Durie, am acting as transcriptionist for Thomes Dinning, MD.  I have reviewed the above documentation for accuracy and completeness, and I agree with the above. -Thomes Dinning, MD

## 2022-07-02 ENCOUNTER — Encounter (INDEPENDENT_AMBULATORY_CARE_PROVIDER_SITE_OTHER): Payer: Self-pay | Admitting: Internal Medicine

## 2022-07-02 ENCOUNTER — Ambulatory Visit (INDEPENDENT_AMBULATORY_CARE_PROVIDER_SITE_OTHER): Payer: Commercial Managed Care - HMO | Admitting: Internal Medicine

## 2022-07-02 VITALS — BP 128/87 | HR 75 | Temp 97.4°F | Ht 65.0 in

## 2022-07-02 DIAGNOSIS — E669 Obesity, unspecified: Secondary | ICD-10-CM | POA: Diagnosis not present

## 2022-07-02 DIAGNOSIS — E559 Vitamin D deficiency, unspecified: Secondary | ICD-10-CM | POA: Diagnosis not present

## 2022-07-02 DIAGNOSIS — I10 Essential (primary) hypertension: Secondary | ICD-10-CM | POA: Diagnosis not present

## 2022-07-02 DIAGNOSIS — M170 Bilateral primary osteoarthritis of knee: Secondary | ICD-10-CM | POA: Diagnosis not present

## 2022-07-02 DIAGNOSIS — Z6834 Body mass index (BMI) 34.0-34.9, adult: Secondary | ICD-10-CM

## 2022-07-02 NOTE — Assessment & Plan Note (Signed)
She had elevated vitamin D levels and since has stopped vitamin D supplementation.  She was advised to resume vitamin D at 2000 international units every other day.  We will check levels at 3 months.

## 2022-07-02 NOTE — Progress Notes (Signed)
Office: [phone]  /  Fax: [phone]  WEIGHT SUMMARY AND BIOMETRICS  Medical Weight Loss Height: 5\' 5"  (1.651 m) Weight: 210 lb (95.3 kg) Temp: (!) 97.4 F (36.3 C) Pulse Rate: 75 BP: 128/87 SpO2: 97 % Fasting: no Labs: no Today's Visit #: 2 Weight at Last VIsit: 214 lb Weight Lost Since Last Visit: 4 lb  Body Fat %: 47.7 % Fat Mass (lbs): 100.2 lbs Muscle Mass (lbs): 104.2 lbs Total Body Water (lbs): 75.8 lbs Visceral Fat Rating : 13 Peak Weight: 217 lb Starting Date: 06/18/22 Starting Weight: 214 lb Total Weight Loss (lbs): 4 lb (1.814 kg)    HPI  Chief Complaint: OBESITY  Wendy Freeman is here to discuss her progress with her obesity treatment plan. She is on the the Category 1 Plan and states she is following her eating plan approximately 80 % of the time. She states she is not exercising.   Interval History: Since last office she has lost 4 pounds.  She reports good adherence to reduced calorie plan, has increased vegetables and has reduced eating out.  She has also cut out processed foods.  Denies problems with appetite and hunger signals. Denies problems with satiety and satiation. Denies problems with eating patterns and portion control. Stress levels are reported as low and manageable. Barriers identified none.  She notes an improvement in cravings and if no longer frequently snacking.   Pharmacotherapy: None  PHYSICAL EXAM:  Blood pressure 128/87, pulse 75, temperature (!) 97.4 F (36.3 C), height 5\' 5"  (1.651 m), last menstrual period 07/03/2009, SpO2 97 %. Body mass index is 35.61 kg/m.  General: She is overweight, cooperative, alert, well developed, and in no acute distress. PSYCH: Has normal mood, affect and thought process.   HEENT: EOMI, sclerae are anicteric. Lungs: Normal breathing effort, no conversational dyspnea. Extremities: No edema.  Neurologic: No gross sensory or motor deficits. No tremors or fasciculations noted.    DIAGNOSTIC DATA  REVIEWED:  BMET    Component Value Date/Time   NA 134 (L) 08/20/2009 1224   K 3.9 08/20/2009 1224   CL 101 08/20/2009 1224   CO2 27 08/20/2009 1224   GLUCOSE 82 08/20/2009 1224   BUN 8 08/20/2009 1224   CREATININE 0.80 03/18/2022 1800   CALCIUM 9.2 08/20/2009 1224   GFRNONAA >60 08/20/2009 1224   GFRAA  08/20/2009 1224    >60        The eGFR has been calculated using the MDRD equation. This calculation has not been validated in all clinical situations. eGFR's persistently <60 mL/min signify possible Chronic Kidney Disease.   Lab Results  Component Value Date   HGBA1C 5.2 06/18/2022   Lab Results  Component Value Date   INSULIN 5.7 06/18/2022   No results found for: "TSH" CBC    Component Value Date/Time   WBC 6.5 08/20/2009 1224   RBC 4.20 08/20/2009 1224   HGB 13.3 08/20/2009 1224   HCT 39.2 08/20/2009 1224   PLT 236 08/20/2009 1224   MCV 93.4 08/20/2009 1224   MCHC 33.9 08/20/2009 1224   RDW 13.7 08/20/2009 1224   Iron Studies No results found for: "IRON", "TIBC", "FERRITIN", "IRONPCTSAT" Lipid Panel  No results found for: "CHOL", "TRIG", "HDL", "CHOLHDL", "VLDL", "LDLCALC", "LDLDIRECT" Hepatic Function Panel  No results found for: "PROT", "ALBUMIN", "AST", "ALT", "ALKPHOS", "BILITOT", "BILIDIR", "IBILI" No results found for: "TSH" Nutritional No results found for: "VD25OH"   ASSESSMENT AND PLAN  TREATMENT PLAN FOR OBESITY:  Recommended Dietary Goals  Wendy Freeman is currently in the action stage of change. As such, her goal is to continue weight management plan. She has agreed to the Category 1 Plan.  Behavioral Intervention  We discussed the following Behavioral Modification Strategies today: increasing lean protein intake, increasing vegetables, avoid skipping meals, think about ways to increase physical activity, avoid or reduce consumption of processed foods, reading food labels and making healthy choices when eating convenient foods, and increase  water .  Additional resources provided today: NA  Recommended Physical Activity Goals  Wendy Freeman has been advised to work up to 150 minutes of moderate intensity aerobic activity a week and strengthening exercises 2-3 times per week for cardiovascular health, weight loss maintenance and preservation of muscle mass.   She has agreed to increase physical activity in their day and reduce sedentary time (increase NEAT).    Pharmacotherapy We discussed various medication options to help Wendy Freeman with her weight loss efforts and we both agreed to continue with nutritional and behavioral strategies.  ASSOCIATED CONDITIONS ADDRESSED TODAY  Osteoarthritis of both knees, unspecified osteoarthritis type Assessment & Plan: Exacerbated by biomechanical effects of her weight.  Pain should improve with weight loss of 10% of body weight.  She will also be starting physical therapy.  We also discussed other exercise activities with less impact on her knees including aqua aerobics.   Hypertension, unspecified type Assessment & Plan: Blood pressure close to goal goal for age and risk category.  On ARB hydrochlorothiazide without adverse effects. Most recent renal parameters reviewed which showed normal electrolytes and kidney function.  Continue with weight loss therapy.  Monitor for symptoms of orthostasis while losing weight. Continue current regimen and home monitoring for a goal blood pressure of 120/80.    Obesity with current BMI of 34.9 Assessment & Plan: We reviewed most recent labs.  Her hemoglobin A1c was 5.2 and she has optimal insulin levels so no signs of prediabetes or insulin resistance.  Blood pressure should improve with weight loss.   Vitamin D deficiency Assessment & Plan: She had elevated vitamin D levels and since has stopped vitamin D supplementation.  She was advised to resume vitamin D at 2000 international units every other day.  We will check levels at 3 months.        Return in about 2 years (around 07/02/2024) for For Weight Mangement with Dr. Gerarda Fraction.Marland Kitchen She was informed of the importance of frequent follow up visits to maximize her success with intensive lifestyle modifications for her multiple health conditions.   ATTESTASTION STATEMENTS:  Reviewed by clinician on day of visit: allergies, medications, problem list, medical history, surgical history, family history, social history, and previous encounter notes.   Time spent on visit including pre-visit chart review and post-visit care and charting was 40 minutes.    Thomes Dinning, MD

## 2022-07-02 NOTE — Assessment & Plan Note (Signed)
Exacerbated by biomechanical effects of her weight.  Pain should improve with weight loss of 10% of body weight.  She will also be starting physical therapy.  We also discussed other exercise activities with less impact on her knees including aqua aerobics.

## 2022-07-02 NOTE — Assessment & Plan Note (Signed)
Blood pressure close to goal goal for age and risk category.  On ARB hydrochlorothiazide without adverse effects. Most recent renal parameters reviewed which showed normal electrolytes and kidney function.  Continue with weight loss therapy.  Monitor for symptoms of orthostasis while losing weight. Continue current regimen and home monitoring for a goal blood pressure of 120/80.

## 2022-07-02 NOTE — Assessment & Plan Note (Signed)
We reviewed most recent labs.  Her hemoglobin A1c was 5.2 and she has optimal insulin levels so no signs of prediabetes or insulin resistance.  Blood pressure should improve with weight loss.

## 2022-07-17 ENCOUNTER — Ambulatory Visit (INDEPENDENT_AMBULATORY_CARE_PROVIDER_SITE_OTHER): Payer: Commercial Managed Care - HMO | Admitting: Internal Medicine

## 2022-07-17 ENCOUNTER — Encounter (INDEPENDENT_AMBULATORY_CARE_PROVIDER_SITE_OTHER): Payer: Self-pay | Admitting: Internal Medicine

## 2022-07-17 VITALS — BP 115/8 | HR 68 | Temp 97.8°F | Ht 65.0 in | Wt 210.0 lb

## 2022-07-17 DIAGNOSIS — Z6834 Body mass index (BMI) 34.0-34.9, adult: Secondary | ICD-10-CM | POA: Diagnosis not present

## 2022-07-17 DIAGNOSIS — E669 Obesity, unspecified: Secondary | ICD-10-CM

## 2022-07-17 DIAGNOSIS — I10 Essential (primary) hypertension: Secondary | ICD-10-CM | POA: Diagnosis not present

## 2022-07-17 NOTE — Progress Notes (Signed)
Office: [phone]  /  Fax: [phone]  WEIGHT SUMMARY AND BIOMETRICS  Medical Weight Loss Height: 5' 5"$  (1.651 m) Weight: 210 lb (95.3 kg) Temp: 97.8 F (36.6 C) Pulse Rate: 68 BP: (!) 115/8 SpO2: 97 % Fasting: no Labs: no Today's Visit #: 3 Weight at Last VIsit: 210 lb Weight Lost Since Last Visit: 0 lb  Body Fat %: 48.8 % Fat Mass (lbs): 102.4 lbs Muscle Mass (lbs): 102.2 lbs Total Body Water (lbs): 79.6 lbs Visceral Fat Rating : 14 Peak Weight: 217 lb Starting Date: 06/18/22 Starting Weight: 214 lb Total Weight Loss (lbs): 4 lb (1.814 kg)    HPI  Chief Complaint: OBESITY  Wendy Freeman is here to discuss her progress with her obesity treatment plan. She is on the the Category 1 Plan and states she is following her eating plan approximately 80 % of the time. She states she is yard work for several hours weekly.   Interval History:  Since last office visit she remains weight neutral.   She reports good adherence to nutritional plan but acknowledges eating out several times over the last 2 weeks.  She did pick healthy food choices but is certain she exceeded calories.  Denies problems with appetite and hunger signals.  Denies problems with satiety and satiation.  Denies problems with eating patterns and portion control.  Stress levels are reported as high and due to she is getting married in June and is having construction at her home .  Barriers identified none.    Pharmacotherapy: None   PHYSICAL EXAM:  Blood pressure (!) 115/8, pulse 68, temperature 97.8 F (36.6 C), height 5' 5"$  (1.651 m), weight 210 lb (95.3 kg), last menstrual period 07/03/2009, SpO2 97 %. Body mass index is 34.95 kg/m.  General: She is overweight, cooperative, alert, well developed, and in no acute distress. PSYCH: Has normal mood, affect and thought process.   HEENT: EOMI, sclerae are anicteric. Lungs: Normal breathing effort, no conversational dyspnea. Extremities: No edema.   Neurologic: No gross sensory or motor deficits. No tremors or fasciculations noted.    ASSESSMENT AND PLAN  TREATMENT PLAN FOR OBESITY:  Recommended Dietary Goals  Zymia is currently in the action stage of change. As such, her goal is to continue weight management plan. She has agreed to the Category 1 Plan.  Behavioral Intervention  We discussed the following Behavioral Modification Strategies today: increasing lean protein intake, increasing vegetables, increasing fiber rich foods, avoid skipping meals, and increase water intake.  Additional resources provided today: NA  Recommended Physical Activity Goals  Arshia has been advised to work up to 150 minutes of moderate intensity aerobic activity a week and strengthening exercises 2-3 times per week for cardiovascular health, weight loss maintenance and preservation of muscle mass.   She has agreed to increase physical activity in their day and reduce sedentary time (increase NEAT).    Pharmacotherapy We discussed various medication options to help Aitanna with her weight loss efforts and we both agreed to continue with nutritional and behavioral strategies.  ASSOCIATED CONDITIONS ADDRESSED TODAY  Hypertension, unspecified type Assessment & Plan: Blood pressure at goal for age and risk category.  On losartan hydrochlorothiazide 50 mg without adverse effects.  Most recent renal parameters reviewed which showed normal electrolytes and kidney function.  Continue with weight loss therapy.  Monitor for symptoms of orthostasis while losing weight. Continue current regimen and home monitoring for a goal blood pressure of 120/80.    Obesity with current BMI of  34.9      DIAGNOSTIC DATA REVIEWED:  BMET    Component Value Date/Time   NA 134 (L) 08/20/2009 1224   K 3.9 08/20/2009 1224   CL 101 08/20/2009 1224   CO2 27 08/20/2009 1224   GLUCOSE 82 08/20/2009 1224   BUN 8 08/20/2009 1224   CREATININE 0.80 03/18/2022 1800    CALCIUM 9.2 08/20/2009 1224   GFRNONAA >60 08/20/2009 1224   GFRAA  08/20/2009 1224    >60        The eGFR has been calculated using the MDRD equation. This calculation has not been validated in all clinical situations. eGFR's persistently <60 mL/min signify possible Chronic Kidney Disease.   Lab Results  Component Value Date   HGBA1C 5.2 06/18/2022   Lab Results  Component Value Date   INSULIN 5.7 06/18/2022   No results found for: "TSH" CBC    Component Value Date/Time   WBC 6.5 08/20/2009 1224   RBC 4.20 08/20/2009 1224   HGB 13.3 08/20/2009 1224   HCT 39.2 08/20/2009 1224   PLT 236 08/20/2009 1224   MCV 93.4 08/20/2009 1224   MCHC 33.9 08/20/2009 1224   RDW 13.7 08/20/2009 1224   Iron Studies No results found for: "IRON", "TIBC", "FERRITIN", "IRONPCTSAT" Lipid Panel  No results found for: "CHOL", "TRIG", "HDL", "CHOLHDL", "VLDL", "LDLCALC", "LDLDIRECT" Hepatic Function Panel  No results found for: "PROT", "ALBUMIN", "AST", "ALT", "ALKPHOS", "BILITOT", "BILIDIR", "IBILI" No results found for: "TSH" Nutritional No results found for: "VD25OH"    Return in about 2 weeks (around 07/31/2022) for For Weight Mangement with Dr. Gerarda Fraction.Marland Kitchen She was informed of the importance of frequent follow up visits to maximize her success with intensive lifestyle modifications for her multiple health conditions.    ATTESTASTION STATEMENTS:  Reviewed by clinician on day of visit: allergies, medications, problem list, medical history, surgical history, family history, social history, and previous encounter notes.   Time spent on visit including pre-visit chart review and post-visit care and charting was 30 went up to 3 as she is that she see that she sees Mallie Mussel did she had not know that he Tatian murmur she was 1 that we refer minutes.    Thomes Dinning, MD

## 2022-07-17 NOTE — Assessment & Plan Note (Signed)
Blood pressure at goal for age and risk category.  On losartan hydrochlorothiazide 50 mg without adverse effects.  Most recent renal parameters reviewed which showed normal electrolytes and kidney function.  Continue with weight loss therapy.  Monitor for symptoms of orthostasis while losing weight. Continue current regimen and home monitoring for a goal blood pressure of 120/80.

## 2022-08-11 ENCOUNTER — Ambulatory Visit (INDEPENDENT_AMBULATORY_CARE_PROVIDER_SITE_OTHER): Payer: Commercial Managed Care - HMO | Admitting: Internal Medicine

## 2022-08-12 ENCOUNTER — Telehealth (INDEPENDENT_AMBULATORY_CARE_PROVIDER_SITE_OTHER): Payer: Self-pay

## 2022-08-12 ENCOUNTER — Ambulatory Visit (INDEPENDENT_AMBULATORY_CARE_PROVIDER_SITE_OTHER): Payer: Commercial Managed Care - HMO | Admitting: Internal Medicine

## 2022-08-12 VITALS — BP 130/87 | HR 69 | Temp 98.0°F | Ht 65.0 in | Wt 208.0 lb

## 2022-08-12 DIAGNOSIS — Z6834 Body mass index (BMI) 34.0-34.9, adult: Secondary | ICD-10-CM

## 2022-08-12 DIAGNOSIS — E669 Obesity, unspecified: Secondary | ICD-10-CM | POA: Diagnosis not present

## 2022-08-12 DIAGNOSIS — E559 Vitamin D deficiency, unspecified: Secondary | ICD-10-CM

## 2022-08-12 DIAGNOSIS — I10 Essential (primary) hypertension: Secondary | ICD-10-CM

## 2022-08-12 NOTE — Telephone Encounter (Signed)
error 

## 2022-08-12 NOTE — Progress Notes (Signed)
Office: [phone]  /  Fax: [phone]  WEIGHT SUMMARY AND BIOMETRICS  Vitals Temp: 98 F (36.7 C) BP: 130/87 Pulse Rate: 69 SpO2: 95 %   Anthropometric Measurements Height: '5\' 5"'$  (1.651 m) Weight: 208 lb (94.3 kg) BMI (Calculated): 34.61 Weight at Last Visit: 210 lb Weight Lost Since Last Visit: 2 lb Starting Weight: 214 lb Total Weight Loss (lbs): 6 lb (2.722 kg)   Body Composition  Body Fat %: 48.2 % Fat Mass (lbs): 100.6 lbs Muscle Mass (lbs): 102.8 lbs Total Body Water (lbs): 78.8 lbs Visceral Fat Rating : 13    HPI  Chief Complaint: OBESITY  Altheria is here to discuss her progress with her obesity treatment plan. She is on the the Category 1 Plan and states she is following her eating plan approximately 70 % of the time. She states she is doing yard work eight hours a week.  Interval History:  Since last office visit she has lost 2 pounds. She reports fair adherence to reduced calorie nutritional plan.  It varies from week to week due to eating out, hosting and socialization. She has been working on eating healthy while eating out.  She is also trying a liquid supplement high in protein and low in carbs as a meal replacement. '[x]'$ Denies '[]'$ Reports problems with appetite and hunger signals.  '[x]'$ Denies '[]'$ Reports problems with satiety and satiation.  '[]'$ Denies '[x]'$ Reports problems with eating patterns and portion control.  '[]'$ Denies '[x]'$ Reports abnormal cravings Barriers identified exposure to enticing environments and or relationships.   Pharmacotherapy for weight loss: She is currently taking no anti-obesity medication.    ASSESSMENT AND PLAN  TREATMENT PLAN FOR OBESITY:  Recommended Dietary Goals  Crystle is currently in the action stage of change. As such, her goal is to continue weight management plan. She has agreed to: the Category 1 Plan.  Behavioral Intervention  We discussed the following Behavioral Modification Strategies today: increasing  lean protein intake, decreasing simple carbohydrates , increasing vegetables, increasing water intake, work on meal planning and easy cooking plans, decreasing eating out, consumption of processed foods, and making healthy choices when eating convenient foods, and reading food labels .  Additional resources provided today: None  Recommended Physical Activity Goals  Loie has been advised to work up to 150 minutes of moderate intensity aerobic activity a week and strengthening exercises 2-3 times per week for cardiovascular health, weight loss maintenance and preservation of muscle mass.   She has agreed to :  '[]'$  Continue current level of physical activity  '[x]'$  Think about ways to increase physical activity '[]'$  Start strengthening exercises with a goal of 2-3 sessions a week  '[]'$  Start aerobic activity with a goal of 150 minutes a week at moderate intensity.  '[]'$  Increase the intensity, frequency or duration of strengthening exercises  '[]'$  Increase the intensity, frequency or duration of aerobic exercises   '[]'$  Increase physical activity in their day and reduce sedentary time (increase NEAT). '[]'$  Work on scheduling and tracking physical activity.   Pharmacotherapy We discussed various medication options to help Lynsee with her weight loss efforts and we both agreed to : explore incretin therapy.  We reviewed benefits, risk and contraindications. Maybree would benefit from pharmacotherapy to assist with hunger signals, satiety and cravings. This will reduce obesity-related health risks by inducing weight loss, and help reduce food consumption and adherence to [facility]) . It may also improve QOL by improving self-confidence and reduce the  setbacks associated with metabolic adaptations. She will look into  coverage for Wegovy or Zepbound.     ASSOCIATED CONDITIONS ADDRESSED TODAY  Hypertension, unspecified type Assessment & Plan: Blood pressure slightly above target today previous readings have been at  goal.  She is currently on losartan and hydrochlorothiazide without any adverse effects.  She will continue current treatment.  She will monitor for orthostasis while losing weight.  Recommend monitoring blood pressure at home to look at trends.   Obesity with current BMI of 34.9     PHYSICAL EXAM:  Blood pressure 130/87, pulse 69, temperature 98 F (36.7 C), height '5\' 5"'$  (1.651 m), weight 208 lb (94.3 kg), last menstrual period 07/03/2009, SpO2 95 %. Body mass index is 34.61 kg/m.  General: She is overweight, cooperative, alert, well developed, and in no acute distress. PSYCH: Has normal mood, affect and thought process.   HEENT: EOMI, sclerae are anicteric. Lungs: Normal breathing effort, no conversational dyspnea. Extremities: No edema.  Neurologic: No gross sensory or motor deficits. No tremors or fasciculations noted.    DIAGNOSTIC DATA REVIEWED:  BMET    Component Value Date/Time   NA 134 (L) 08/20/2009 1224   K 3.9 08/20/2009 1224   CL 101 08/20/2009 1224   CO2 27 08/20/2009 1224   GLUCOSE 82 08/20/2009 1224   BUN 8 08/20/2009 1224   CREATININE 0.80 03/18/2022 1800   CALCIUM 9.2 08/20/2009 1224   GFRNONAA >60 08/20/2009 1224   GFRAA  08/20/2009 1224    >60        The eGFR has been calculated using the MDRD equation. This calculation has not been validated in all clinical situations. eGFR's persistently <60 mL/min signify possible Chronic Kidney Disease.   Lab Results  Component Value Date   HGBA1C 5.2 06/18/2022   Lab Results  Component Value Date   INSULIN 5.7 06/18/2022   No results found for: "TSH" CBC    Component Value Date/Time   WBC 6.5 08/20/2009 1224   RBC 4.20 08/20/2009 1224   HGB 13.3 08/20/2009 1224   HCT 39.2 08/20/2009 1224   PLT 236 08/20/2009 1224   MCV 93.4 08/20/2009 1224   MCHC 33.9 08/20/2009 1224   RDW 13.7 08/20/2009 1224   Iron Studies No results found for: "IRON", "TIBC", "FERRITIN", "IRONPCTSAT" Lipid Panel  No  results found for: "CHOL", "TRIG", "HDL", "CHOLHDL", "VLDL", "LDLCALC", "LDLDIRECT" Hepatic Function Panel  No results found for: "PROT", "ALBUMIN", "AST", "ALT", "ALKPHOS", "BILITOT", "BILIDIR", "IBILI" No results found for: "TSH" Nutritional No results found for: "VD25OH"   Return in about 2 weeks (around 08/26/2022) for For Weight Mangement with Dr. Gerarda Fraction.Marland Kitchen She was informed of the importance of frequent follow up visits to maximize her success with intensive lifestyle modifications for her multiple health conditions.   ATTESTASTION STATEMENTS:  Reviewed by clinician on day of visit: allergies, medications, problem list, medical history, surgical history, family history, social history, and previous encounter notes.     Thomes Dinning, MD

## 2022-08-12 NOTE — Assessment & Plan Note (Signed)
Blood pressure slightly above target today previous readings have been at goal.  She is currently on losartan and hydrochlorothiazide without any adverse effects.  She will continue current treatment.  She will monitor for orthostasis while losing weight.  Recommend monitoring blood pressure at home to look at trends.

## 2022-08-27 ENCOUNTER — Ambulatory Visit (INDEPENDENT_AMBULATORY_CARE_PROVIDER_SITE_OTHER): Payer: Commercial Managed Care - HMO | Admitting: Internal Medicine

## 2022-09-10 ENCOUNTER — Ambulatory Visit (INDEPENDENT_AMBULATORY_CARE_PROVIDER_SITE_OTHER): Payer: Commercial Managed Care - HMO | Admitting: Internal Medicine

## 2024-01-26 ENCOUNTER — Other Ambulatory Visit: Payer: Self-pay | Admitting: Family Medicine

## 2024-01-26 DIAGNOSIS — M25462 Effusion, left knee: Secondary | ICD-10-CM

## 2024-02-03 ENCOUNTER — Ambulatory Visit
Admission: RE | Admit: 2024-02-03 | Discharge: 2024-02-03 | Disposition: A | Discharge status: Home / Self Care | Source: Ambulatory Visit | Attending: Family Medicine | Admitting: Family Medicine

## 2024-02-03 DIAGNOSIS — M25562 Pain in left knee: Secondary | ICD-10-CM

## 2024-02-29 ENCOUNTER — Other Ambulatory Visit: Payer: Self-pay | Admitting: Family Medicine

## 2024-02-29 DIAGNOSIS — M25462 Effusion, left knee: Secondary | ICD-10-CM

## 2024-04-06 ENCOUNTER — Ambulatory Visit
Admission: RE | Admit: 2024-04-06 | Discharge: 2024-04-06 | Disposition: A | Discharge status: Home / Self Care | Source: Ambulatory Visit | Attending: Family Medicine | Admitting: Family Medicine

## 2024-04-06 DIAGNOSIS — M25462 Effusion, left knee: Secondary | ICD-10-CM
